# Patient Record
Sex: Male | Born: 1975 | Race: White | Hispanic: No | Marital: Married | Smoking: Never smoker
Health system: Southern US, Community
[De-identification: ages and names within clinical notes are randomized; demographics above are authoritative.]

## PROBLEM LIST (undated history)

## (undated) DIAGNOSIS — G518 Other disorders of facial nerve: Secondary | ICD-10-CM

## (undated) DIAGNOSIS — Z8249 Family history of ischemic heart disease and other diseases of the circulatory system: Secondary | ICD-10-CM

## (undated) DIAGNOSIS — R42 Dizziness and giddiness: Secondary | ICD-10-CM

## (undated) DIAGNOSIS — K219 Gastro-esophageal reflux disease without esophagitis: Secondary | ICD-10-CM

## (undated) HISTORY — PX: COLONOSCOPY: SHX174

## (undated) HISTORY — PX: TONSILLECTOMY: SUR1361

## (undated) HISTORY — DX: Other disorders of facial nerve: G51.8

## (undated) HISTORY — DX: Family history of ischemic heart disease and other diseases of the circulatory system: Z82.49

## (undated) HISTORY — PX: APPENDECTOMY: SHX54

## (undated) HISTORY — PX: UPPER GASTROINTESTINAL ENDOSCOPY: SHX188

## (undated) HISTORY — DX: Dizziness and giddiness: R42

---

## 2015-04-06 ENCOUNTER — Ambulatory Visit: Payer: Self-pay | Admitting: Family Medicine

## 2017-04-03 ENCOUNTER — Ambulatory Visit: Payer: Managed Care, Other (non HMO) | Admitting: Neurology

## 2017-04-04 ENCOUNTER — Encounter: Payer: Self-pay | Admitting: Neurology

## 2017-05-18 ENCOUNTER — Ambulatory Visit (INDEPENDENT_AMBULATORY_CARE_PROVIDER_SITE_OTHER): Payer: Managed Care, Other (non HMO) | Admitting: Neurology

## 2017-05-18 ENCOUNTER — Encounter (INDEPENDENT_AMBULATORY_CARE_PROVIDER_SITE_OTHER): Payer: Self-pay

## 2017-05-18 ENCOUNTER — Encounter: Payer: Self-pay | Admitting: Neurology

## 2017-05-18 VITALS — BP 129/85 | HR 67 | Ht 72.5 in | Wt 229.0 lb

## 2017-05-18 DIAGNOSIS — G518 Other disorders of facial nerve: Secondary | ICD-10-CM

## 2017-05-18 DIAGNOSIS — M792 Neuralgia and neuritis, unspecified: Secondary | ICD-10-CM

## 2017-05-18 DIAGNOSIS — H9190 Unspecified hearing loss, unspecified ear: Secondary | ICD-10-CM

## 2017-05-18 DIAGNOSIS — R413 Other amnesia: Secondary | ICD-10-CM

## 2017-05-18 DIAGNOSIS — R42 Dizziness and giddiness: Secondary | ICD-10-CM

## 2017-05-18 DIAGNOSIS — R519 Headache, unspecified: Secondary | ICD-10-CM

## 2017-05-18 DIAGNOSIS — R51 Headache with orthostatic component, not elsewhere classified: Secondary | ICD-10-CM

## 2017-05-18 DIAGNOSIS — I671 Cerebral aneurysm, nonruptured: Secondary | ICD-10-CM

## 2017-05-18 DIAGNOSIS — H539 Unspecified visual disturbance: Secondary | ICD-10-CM

## 2017-05-18 NOTE — Progress Notes (Signed)
[facility]    Provider:  Dr Lucia GaskinsAhern Referring Provider: Joycelyn RuaMeyers, Stephen, MD Primary Care Physician:  Joycelyn RuaMeyers, Stephen, MD  CC:  Headache, dizziness  HPI:  Ralph Perez is a 41 y.o. male here as a referral from Dr. Lenise ArenaMeyers for headaches for 5-6 years or longer. Started in 2011, he gets pains in his head like an electric shock, will last 2-3 seconds. Father passed away from an aneurysm. The symptoms would occur once a month, no known triggers could be anytime day or night. 3 years ago [place] and he had an episode of car sickness, since then he has motion sickness. He can't get on a swing, he has a spinning in his head, any movement will cause the spinning but if he stops moving it will go away. He was on a boat and felt spinning, no nausea or vomiting. In the last 5-6 months the pain is more frequent and longer lasting and more severe lasts 5-6 seconds. He laid down last night and had the spinning sensation, positional head pain. He feels like he is having a hard time remembering things, he is struggling to remember things that are easy and it takes him a second. For example, takes him a minute to remember names of famous actors for example. Blurry vision. The shooting pains are in the left side of the forehead, feels like an electrical shock just once for a few seconds, happens once a week and can stop him because it can be severe, has not woken him up at night, No lacrimation or rhinorrhea or other autonomic symptoms. He has light sensitivity. He has pulsatile tinnitus. No headaches. No other focal neurologic deficits, associated symptoms, inciting events or modifiable factors. Does not happen at the same time.   Review of Systems: Patient complains of symptoms per HPI as well as the following symptoms: Memory loss, headache, dizziness, spinning sensation: . Pertinent negatives and positives per HPI. All others negative.   Social History   Social History  .  Marital status: Married    Spouse name: N/A  . Number of children: N/A  . Years of education: N/A   Occupational History  . Not on file.   Social History Main Topics  . Smoking status: Never Smoker  . Smokeless tobacco: Never Used  . Alcohol use Yes  . Drug use: No  . Sexual activity: Not on file   Other Topics Concern  . Not on file   Social History Narrative  . No narrative on file    Family History  Problem Relation Age of Onset  . Aneurysm Father   . Clotting disorder Father     Past Medical History:  Diagnosis Date  . Facial neuralgia   . FHx: brain aneurysm   . Vertigo     Past Surgical History:  Procedure Laterality Date  . APPENDECTOMY      No current outpatient prescriptions on file.   No current facility-administered medications for this visit.     Allergies as of 05/18/2017  . (No Known Allergies)    Vitals: BP 129/85   Pulse 67   Ht 6' 0.5" (1.842 m)   Wt 229 lb (103.9 kg)   BMI 30.63 kg/m  Last Weight:  Wt Readings from Last 1 Encounters:  05/18/17 229 lb (103.9 kg)   Last Height:   Ht Readings from Last 1 Encounters:  05/18/17 6' 0.5" (1.842 m)    Physical exam: Exam: Gen: NAD, conversant, well  nourised, obese, well groomed                     CV: RRR, no MRG. No Carotid Bruits. No peripheral edema, warm, nontender Eyes: Conjunctivae clear without exudates or hemorrhage  Neuro: Detailed Neurologic Exam  Speech:    Speech is normal; fluent and spontaneous with normal comprehension.  Cognition:    The patient is oriented to person, place, and time;     recent and remote memory intact;     language fluent;     normal attention, concentration,     fund of knowledge Cranial Nerves:    The pupils are equal, round, and reactive to light. The fundi are normal and spontaneous venous pulsations are present. Visual fields are full to finger confrontation. Extraocular movements are intact. Trigeminal sensation is intact and the  muscles of mastication are normal. The face is symmetric. The palate elevates in the midline. Hearing intact. Voice is normal. Shoulder shrug is normal. The tongue has normal motion without fasciculations.   Coordination:    Normal finger to nose and heel to shin. Normal rapid alternating movements.   Gait:    Heel-toe and tandem gait are normal.   Motor Observation:    No asymmetry, no atrophy, and no involuntary movements noted. Tone:    Normal muscle tone.    Posture:    Posture is normal. normal erect    Strength:    Strength is V/V in the upper and lower limbs.      Sensation: intact to LT     Reflex Exam:  DTR's:    Deep tendon reflexes in the upper and lower extremities are normal bilaterally.   Toes:    The toes are downgoing bilaterally.   Clonus:    Clonus is absent.      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 Need an MRA of the head to evaluate for left-sided cerebral aneurysm especially given the family history of cerebral aneurysm. Patient also reports positional headache, vertigo, memory changes, imbalance, hearing changes so need MRI brain to eval for strokes or lesions such as schwannomas of the 8th cranial nerve. Also recommend f/u with eye doctor. Had labwork completed last year at work, will request labs for review and comparison.  Orders Placed This Encounter  Procedures  . MR BRAIN W WO CONTRAST  . MR MRA HEAD WO CONTRAST  . Comprehensive metabolic panel  . CBC  . B12 and Folate Panel  . Methylmalonic acid, serum  . TSH     Cc:  Joycelyn Rua, MD  Naomie Dean, MD  [facility] [address] [place], [place] [address]  Phone [phone] Fax [phone]

## 2017-05-18 NOTE — Patient Instructions (Addendum)
Remember to drink plenty of fluid, eat healthy meals and do not skip any meals. Try to eat protein with a every meal and eat a healthy snack such as fruit or nuts in between meals. Try to keep a regular sleep-wake schedule and try to exercise daily, particularly in the form of walking, 20-30 minutes a day, if you can.   As far as diagnostic testing: MRI brain and MRA of the head  I would like to see you back in 6 weeks, sooner if we need to. Please call us with any interim questions, concerns, problems, updates or refill requests.   Please also call us for any test results so we can go over those with you on the phone.  My clinical assistant and will answer any of your questions and relay your messages to me and also relay most of my messages to you.   Our phone number is [phone]. We also have an after hours call service for urgent matters and there is a physician on-call for urgent questions. For any emergencies you know to call 911 or go to the nearest emergency room  Differential: Supraorbital neuralgia, trigeminal neuragia, or Trigeminal Autonomic Cephalgias (SUNA, SUNCT, Cluster)   Dizziness Dizziness is a common problem. It is a feeling of unsteadiness or light-headedness. You may feel like you are about to faint. Dizziness can lead to injury if you stumble or fall. Anyone can become dizzy, but dizziness is more common in older adults. This condition can be caused by a number of things, including medicines, dehydration, or illness. Follow these instructions at home: Taking these steps may help with your condition: Eating and drinking  Drink enough fluid to keep your urine clear or pale yellow. This helps to keep you from becoming dehydrated. Try to drink more clear fluids, such as water.  Do not drink alcohol.  Limit your caffeine intake if directed by your health care provider.  Limit your salt intake if directed by your health care provider. Activity  Avoid making quick  movements. ? Rise slowly from chairs and steady yourself until you feel okay. ? In the morning, first sit up on the side of the bed. When you feel okay, stand slowly while you hold onto something until you know that your balance is fine.  Move your legs often if you need to stand in one place for a long time. Tighten and relax your muscles in your legs while you are standing.  Do not drive or operate heavy machinery if you feel dizzy.  Avoid bending down if you feel dizzy. Place items in your home so that they are easy for you to reach without leaning over. Lifestyle  Do not use any tobacco products, including cigarettes, chewing tobacco, or electronic cigarettes. If you need help quitting, ask your health care provider.  Try to reduce your stress level, such as with yoga or meditation. Talk with your health care provider if you need help. General instructions  Watch your dizziness for any changes.  Take medicines only as directed by your health care provider. Talk with your health care provider if you think that your dizziness is caused by a medicine that you are taking.  Tell a friend or a family member that you are feeling dizzy. If he or she notices any changes in your behavior, have this person call your health care provider.  Keep all follow-up visits as directed by your health care provider. This is important. Contact a health care provider if:  Your dizziness does not go away.  Your dizziness or light-headedness gets worse.  You feel nauseous.  You have reduced hearing.  You have new symptoms.  You are unsteady on your feet or you feel like the room is spinning. Get help right away if:  You vomit or have diarrhea and are unable to eat or drink anything.  You have problems talking, walking, swallowing, or using your arms, hands, or legs.  You feel generally weak.  You are not thinking clearly or you have trouble forming sentences. It may take a friend or family  member to notice this.  You have chest pain, abdominal pain, shortness of breath, or sweating.  Your vision changes.  You notice any bleeding.  You have a headache.  You have neck pain or a stiff neck.  You have a fever. This information is not intended to replace advice given to you by your health care provider. Make sure you discuss any questions you have with your health care provider. Document Released: 03/01/2001 Document Revised: 02/11/2016 Document Reviewed: 09/01/2014 Elsevier Interactive Patient Education  2017 Elsevier Inc.   Cluster Headache A cluster headache is a type of headache that causes deep, intense head pain. Cluster headaches can last from 15 minutes to 3 hours. They usually occur:  On one side of the head. They may occur on the other side when a new cluster of headaches begins.  Repeatedly over weeks to months.  Several times a day.  At the same time of day, often at night.  More often in the fall and springtime.  What are the causes? The cause of this condition is not known. What increases the risk? This condition is more likely to develop in:  Males.  People who drink alcohol.  People who smoke or use products that contain nicotine or tobacco.  People who take medicines that cause blood vessels to expand, such as nitroglycerin.  People who take antihistamines.  What are the signs or symptoms? Symptoms of this condition include:  Severe pain on one side of the head that begins behind or around your eye or temple.  Pain on one side of the head.  Nausea.  Sensitivity to light.  Runny nose and nasal stuffiness.  Sweaty, pale skin on the face.  Droopy or swollen eyelid, eye redness, or tearing.  Restlessness and agitation.  How is this diagnosed? This condition may be diagnosed based on:  Your symptoms.  A physical exam.  Your health care provider may order tests to see if your headaches are caused by another medical  condition. These tests may show that you do not have cluster headaches. Tests may include:  A CT scan of your head.  An MRI of your head.  Lab tests.  How is this treated? This condition may be treated with:  Medicines to relieve pain and to prevent repeated (recurrent) attacks. Some people may need a combination of medicines.  Oxygen. This helps to relieve pain.  Follow these instructions at home: Headache diary Keep a headache diary as told by your health care provider. Doing this can help you and your health care provider figure out what triggers your headaches. In your headache diary, include information about:  The time of day that your headache started and what you were doing when it began.  How long your headache lasted.  Where your pain started and whether it moved to other areas.  The type of pain, such as burning, stabbing, throbbing, or cramping.  Your level of pain. Use a pain scale and rate the pain with a number from 1 (mild) up to 10 (severe).  The treatment that you used, and any change in symptoms after treatment.  Medicines  Take over-the-counter and prescription medicines only as told by your health care provider.  Do not drive or use heavy machinery while taking prescription pain medicine.  Use oxygen as told by your health care provider. Lifestyle  Follow a regular sleep schedule. Do not vary the time that you go to bed or the amount that you sleep from day to day. It is important to stay on the same schedule during a cluster period to help prevent headaches.  Exercise regularly.  Eat a healthy diet and avoid foods that may trigger your headaches.  Avoid alcohol.  Do not use any products that contain nicotine or tobacco, such as cigarettes and e-cigarettes. If you need help quitting, ask your health care provider. Contact a health care provider if:  Your headaches change, become more severe, or occur more often.  The medicine or oxygen that  your health care provider recommended does not help. Get help right away if:  You faint.  You have weakness or numbness, especially on one side of your body or face.  You have double vision.  You have nausea or vomiting that does not go away within several hours.  You have trouble talking, walking, or keeping your balance.  You have pain or stiffness in your neck.  You have a fever. Summary  A cluster headache is a type of headache that causes deep, intense head pain, usually on one side of the head.  Keep a headache diary to help discover what triggers your headaches.  A regular sleep schedule can help prevent headaches. This information is not intended to replace advice given to you by your health care provider. Make sure you discuss any questions you have with your health care provider. Document Released: 09/05/2005 Document Revised: 05/17/2016 Document Reviewed: 05/17/2016 Elsevier Interactive Patient Education  2018 [employer].   Trigeminal Neuralgia Trigeminal neuralgia is a nerve disorder that causes attacks of severe facial pain. The attacks last from a few seconds to several minutes. They can happen for days, weeks, or months and then go away for months or years. Trigeminal neuralgia is also called tic douloureux. What are the causes? This condition is caused by damage to a nerve in the face that is called the trigeminal nerve. An attack can be triggered by:  Talking.  Chewing.  Putting on makeup.  Washing your face.  Shaving your face.  Brushing your teeth.  Touching your face.  What increases the risk? This condition is more likely to develop in:  Women.  People who are 13 years of age or older.  What are the signs or symptoms? The main symptom of this condition is pain in the jaw, lips, eyes, nose, scalp, forehead, and face. The pain may be intense, stabbing, electric, or shock-like. How is this diagnosed? This condition is diagnosed with a  physical exam. A CT scan or MRI may be done to rule out other conditions that can cause facial pain. How is this treated? This condition may be treated with:  Avoiding the things that trigger your attacks.  Pain medicine.  Surgery. This may be done in severe cases if other medical treatment does not provide relief.  Follow these instructions at home:  Take over-the-counter and prescription medicines only as told by your health care  provider.  If you wish to get pregnant, talk with your health care provider before you start trying to get pregnant.  Avoid the things that trigger your attacks. It may help to: ? Chew on the unaffected side of your mouth. ? Avoid touching your face. ? Avoid blasts of hot or cold air. Contact a health care provider if:  Your pain medicine is not helping.  You develop new, unexplained symptoms, such as: ? Double vision. ? Facial weakness. ? Changes in hearing or balance.  You become pregnant. Get help right away if:  Your pain is unbearable, and your pain medicine does not help. This information is not intended to replace advice given to you by your health care provider. Make sure you discuss any questions you have with your health care provider. Document Released: 09/02/2000 Document Revised: 05/08/2016 Document Reviewed: 12/29/2014 Elsevier Interactive Patient Education  [employer]2018 Elsevier Inc.

## 2017-05-19 ENCOUNTER — Telehealth: Payer: Self-pay | Admitting: *Deleted

## 2017-05-19 NOTE — Telephone Encounter (Signed)
-----   Message from Anson FretAntonia B Ahern, MD sent at 05/19/2017 11:25 AM EDT ----- Labs normal

## 2017-05-19 NOTE — Telephone Encounter (Signed)
Called and spoke with patient about normal labs per AA.MD note. He verbalized understanding.

## 2017-05-20 LAB — CBC WITH DIFFERENTIAL/PLATELET
Basophils Absolute: 0 10*3/uL (ref 0.0–0.2)
Basos: 0 %
EOS (ABSOLUTE): 0.2 10*3/uL (ref 0.0–0.4)
EOS: 4 %
HEMATOCRIT: 45.6 % (ref 37.5–51.0)
HEMOGLOBIN: 15.3 g/dL (ref 13.0–17.7)
Immature Grans (Abs): 0 10*3/uL (ref 0.0–0.1)
Immature Granulocytes: 0 %
LYMPHS ABS: 2.2 10*3/uL (ref 0.7–3.1)
Lymphs: 40 %
MCH: 28.8 pg (ref 26.6–33.0)
MCHC: 33.6 g/dL (ref 31.5–35.7)
MCV: 86 fL (ref 79–97)
MONOCYTES: 9 %
Monocytes Absolute: 0.5 10*3/uL (ref 0.1–0.9)
NEUTROS ABS: 2.5 10*3/uL (ref 1.4–7.0)
Neutrophils: 47 %
Platelets: 244 10*3/uL (ref 150–379)
RBC: 5.32 x10E6/uL (ref 4.14–5.80)
RDW: 13.9 % (ref 12.3–15.4)
WBC: 5.4 10*3/uL (ref 3.4–10.8)

## 2017-05-20 LAB — COMPREHENSIVE METABOLIC PANEL
ALBUMIN: 4.7 g/dL (ref 3.5–5.5)
ALK PHOS: 60 IU/L (ref 39–117)
ALT: 32 IU/L (ref 0–44)
AST: 21 IU/L (ref 0–40)
Albumin/Globulin Ratio: 1.7 (ref 1.2–2.2)
BUN / CREAT RATIO: 11 (ref 9–20)
BUN: 11 mg/dL (ref 6–24)
Bilirubin Total: 0.6 mg/dL (ref 0.0–1.2)
CO2: 22 mmol/L (ref 20–29)
CREATININE: 1.01 mg/dL (ref 0.76–1.27)
Calcium: 10.1 mg/dL (ref 8.7–10.2)
Chloride: 103 mmol/L (ref 96–106)
GFR calc non Af Amer: 91 mL/min/{1.73_m2} (ref >59)
GFR, EST AFRICAN AMERICAN: 106 mL/min/{1.73_m2} (ref >59)
GLUCOSE: 91 mg/dL (ref 65–99)
Globulin, Total: 2.8 g/dL (ref 1.5–4.5)
Potassium: 4.3 mmol/L (ref 3.5–5.2)
Sodium: 143 mmol/L (ref 134–144)
TOTAL PROTEIN: 7.5 g/dL (ref 6.0–8.5)

## 2017-05-20 LAB — TSH: TSH: 2.11 u[IU]/mL (ref 0.450–4.500)

## 2017-05-20 LAB — B12 AND FOLATE PANEL
FOLATE: 14.1 ng/mL (ref >3.0)
VITAMIN B 12: 687 pg/mL (ref 232–1245)

## 2017-05-20 LAB — METHYLMALONIC ACID, SERUM: METHYLMALONIC ACID: 182 nmol/L (ref 0–378)

## 2017-05-30 ENCOUNTER — Ambulatory Visit
Admission: RE | Admit: 2017-05-30 | Discharge: 2017-05-30 | Disposition: A | Discharge status: Home / Self Care | Payer: Managed Care, Other (non HMO) | Source: Ambulatory Visit | Attending: Neurology | Admitting: Neurology

## 2017-05-30 DIAGNOSIS — R51 Headache with orthostatic component, not elsewhere classified: Secondary | ICD-10-CM

## 2017-05-30 DIAGNOSIS — M792 Neuralgia and neuritis, unspecified: Secondary | ICD-10-CM

## 2017-05-30 DIAGNOSIS — H9190 Unspecified hearing loss, unspecified ear: Secondary | ICD-10-CM

## 2017-05-30 DIAGNOSIS — R413 Other amnesia: Secondary | ICD-10-CM

## 2017-05-30 DIAGNOSIS — R519 Headache, unspecified: Secondary | ICD-10-CM

## 2017-05-30 DIAGNOSIS — I671 Cerebral aneurysm, nonruptured: Secondary | ICD-10-CM

## 2017-05-30 DIAGNOSIS — H539 Unspecified visual disturbance: Secondary | ICD-10-CM

## 2017-05-30 DIAGNOSIS — R42 Dizziness and giddiness: Secondary | ICD-10-CM

## 2017-05-30 MED ORDER — GADOBENATE DIMEGLUMINE 529 MG/ML IV SOLN
20.0000 mL | Freq: Once | INTRAVENOUS | Status: AC | PRN
  Administered 2017-05-30: 20 mL via INTRAVENOUS

## 2017-06-05 ENCOUNTER — Telehealth: Payer: Self-pay | Admitting: Neurology

## 2017-06-05 NOTE — Telephone Encounter (Signed)
LVM for patient about results per AA,MD note. Gave [facility] phone number if he has further questions or concerns.

## 2017-06-05 NOTE — Telephone Encounter (Signed)
Patient calling to get MRI results. °

## 2017-06-05 NOTE — Telephone Encounter (Signed)
Patient has questions about his MRI and his tongue.

## 2017-06-05 NOTE — Telephone Encounter (Signed)
Called and spoke with patient again about MRI results per AA,MD note. He will f/u with PCP about this. He requested results be faxed to Dr Lenise Arena for them to have a copy.  Verified he has a f/u on 07/04/17 at 830am.   Faxed results to PCP at [phone]. Received confirmation.

## 2017-06-05 NOTE — Telephone Encounter (Signed)
Ralph Perez, MRi brain and MRA head were both normal. Incidentally noted is a 2.1cm cyst at the base of the tongue, likely benign but recommend follow up with primary care wihtin 6 weeks to discuss. MRI report will be out on mycone soon thanks

## 2017-07-04 ENCOUNTER — Ambulatory Visit: Payer: Managed Care, Other (non HMO) | Admitting: Neurology

## 2017-07-04 ENCOUNTER — Telehealth: Payer: Self-pay

## 2017-07-04 NOTE — Telephone Encounter (Signed)
Patient no showed his follow up 07/04/17 with Dr. Lucia Gaskins.

## 2017-07-07 ENCOUNTER — Encounter: Payer: Self-pay | Admitting: Neurology

## 2017-08-21 ENCOUNTER — Ambulatory Visit (INDEPENDENT_AMBULATORY_CARE_PROVIDER_SITE_OTHER): Payer: Managed Care, Other (non HMO) | Admitting: Orthopaedic Surgery

## 2017-08-21 ENCOUNTER — Encounter (INDEPENDENT_AMBULATORY_CARE_PROVIDER_SITE_OTHER): Payer: Self-pay | Admitting: Orthopaedic Surgery

## 2017-08-21 ENCOUNTER — Ambulatory Visit (INDEPENDENT_AMBULATORY_CARE_PROVIDER_SITE_OTHER): Payer: Managed Care, Other (non HMO)

## 2017-08-21 DIAGNOSIS — M25512 Pain in left shoulder: Secondary | ICD-10-CM | POA: Diagnosis not present

## 2017-08-21 MED ORDER — DICLOFENAC SODIUM 75 MG PO TBEC: 75.0000 mg | DELAYED_RELEASE_TABLET | Freq: Two times a day (BID) | ORAL | 2 refills | Status: DC

## 2017-08-21 NOTE — Progress Notes (Signed)
Office Visit Note   Patient: Ralph Perez           Date of Birth: 12-Nov-1975           MRN: [id number] Visit Date: 08/21/2017              Requested by: Ralph Perez, Stephen, MD [address] [place], [place] [id number] PCP: Ralph Perez, Stephen, MD   Assessment & Plan: Visit Diagnoses:  1. Acute pain of left shoulder     Plan: Impression is left SLAP tear.  Prescription for diclofenac.  Referral to Dr. Alvester MorinNewton for intra-articular steroid injection.  Referral to physical therapy.  Recheck in 4 weeks. Total face to face encounter time was greater than 45 minutes and over half of this time was spent in counseling and/or coordination of care.  Follow-Up Instructions: Return in about 4 weeks (around 09/18/2017).   Orders:  Orders Placed This Encounter  Procedures  . XR Shoulder Left  . Ambulatory referral to Physical Medicine Rehab   Meds ordered this encounter  Medications  . diclofenac (VOLTAREN) 75 MG EC tablet    Sig: Take 1 tablet (75 mg total) by mouth 2 (two) times daily.    Dispense:  30 tablet    Refill:  2      Procedures: No procedures performed   Clinical Data: No additional findings.   Subjective: Chief Complaint  Patient presents with  . Left Shoulder - Pain    Ralph Perez is a 41 year old gentleman who comes in today with 4-6-week history of left shoulder pain.  He originally felt the pain when he was bench pressing and felt a pop inside the shoulder.  He endorses a numb feeling deep inside the shoulder.  The pain is worse with abduction and seems to have gotten worse.  He denies any neck pain or radicular symptoms.  Pain is worse with use of the arm and better with rest.    Review of Systems  Constitutional: Negative.   All other systems reviewed and are negative.    Objective: Vital Signs: There were no vitals taken for this visit.  Physical Exam  Constitutional: He is oriented to person, place, and time. He appears well-developed and  well-nourished.  HENT:  Head: Normocephalic and atraumatic.  Eyes: Pupils are equal, round, and reactive to light.  Neck: Neck supple.  Pulmonary/Chest: Effort normal.  Abdominal: Soft.  Musculoskeletal: Normal range of motion.  Neurological: He is alert and oriented to person, place, and time.  Skin: Skin is warm.  Psychiatric: He has a normal mood and affect. His behavior is normal. Judgment and thought content normal.  Nursing note and vitals reviewed.   Ortho Exam Left shoulder exam shows a markedly positive O'Brien test.  Mildly positive crank test.  Rotator cuff is grossly intact and without any significant pain.  Negative speeds test.  Negative impingement.  Negative cross adduction.  Negative apprehension. Specialty Comments:  No specialty comments available.  Imaging: Xr Shoulder Left  Result Date: 08/21/2017 No acute or structural abnormalities    PMFS History: There are no active problems to display for this patient.  Past Medical History:  Diagnosis Date  . Facial neuralgia   . FHx: brain aneurysm   . Vertigo     Family History  Problem Relation Age of Onset  . Aneurysm Father   . Clotting disorder Father     Past Surgical History:  Procedure Laterality Date  . APPENDECTOMY     Social History  Occupational History  . Not on file  Tobacco Use  . Smoking status: Never Smoker  . Smokeless tobacco: Never Used  Substance and Sexual Activity  . Alcohol use: Yes  . Drug use: No  . Sexual activity: Not on file

## 2017-09-04 ENCOUNTER — Ambulatory Visit (INDEPENDENT_AMBULATORY_CARE_PROVIDER_SITE_OTHER): Payer: Managed Care, Other (non HMO) | Admitting: Physical Medicine and Rehabilitation

## 2017-09-04 ENCOUNTER — Encounter (INDEPENDENT_AMBULATORY_CARE_PROVIDER_SITE_OTHER): Payer: Self-pay | Admitting: Physical Medicine and Rehabilitation

## 2017-09-04 ENCOUNTER — Ambulatory Visit (INDEPENDENT_AMBULATORY_CARE_PROVIDER_SITE_OTHER): Payer: Managed Care, Other (non HMO)

## 2017-09-04 VITALS — BP 134/84 | HR 61 | Temp 97.9°F

## 2017-09-04 DIAGNOSIS — G8929 Other chronic pain: Secondary | ICD-10-CM | POA: Diagnosis not present

## 2017-09-04 DIAGNOSIS — M25512 Pain in left shoulder: Secondary | ICD-10-CM | POA: Diagnosis not present

## 2017-09-04 NOTE — Patient Instructions (Signed)

## 2017-09-04 NOTE — Progress Notes (Deleted)
Pt states pain in left shoulder. Picking up items increases pain. -Dye Allergy, -Driver, -BT

## 2017-09-04 NOTE — Progress Notes (Signed)
   Ralph Perez - 41 y.o. male MRN [id number]  Date of birth: Jul 21, 1976  Office Visit Note: Visit Date: 09/04/2017 PCP: Ralph RuaMeyers, Stephen, MD Referred by: Ralph RuaMeyers, Stephen, MD  Subjective: Chief Complaint  Patient presents with  . Left Shoulder - Pain   HPI: Ralph Perez is a 41 year old dominant gentleman who is followed by [name] for his left shoulder pain.  [name] feels like he may have a SLAP tear.    ROS Otherwise per HPI.  Assessment & Plan: Visit Diagnoses:  1. Chronic left shoulder pain     Plan: Findings:  Diagnostic and hopefully therapeutic anesthetic left glenohumeral joint arthrogram.  Patient did seem to have decreased range of motion during the anesthetic phase.    Meds & Orders: No orders of the defined types were placed in this encounter.   Orders Placed This Encounter  Procedures  . Large Joint Inj: L glenohumeral  . XR C-ARM NO REPORT    Follow-up: Return for [name] as scheduled.   Procedures: Large Joint Inj: L glenohumeral on 09/04/2017 9:36 AM Indications: pain and diagnostic evaluation Details: 22 G 3.5 in needle, anteromedial approach  Arthrogram: Yes  Medications: 80 mg triamcinolone acetonide 40 MG/ML; 3 mL bupivacaine 0.5 %  Arthrogram demonstrated excellent flow of contrast throughout the joint surface without extravasation or obvious defect.  The patient had relief of symptoms during the anesthetic phase of the injection.  Procedure, treatment alternatives, risks and benefits explained, specific risks discussed. Consent was given by the patient. Immediately prior to procedure a time out was called to verify the correct patient, procedure, equipment, support staff and site/side marked as required. Patient was prepped and draped in the usual sterile fashion.      No notes on file   Clinical History: No specialty comments available.  He reports that  has never smoked. he has never used smokeless tobacco. No results for input(s): HGBA1C,  LABURIC in the last 8760 hours.  Objective:  VS:  HT:    WT:   BMI:     BP:134/84  HR:61bpm  TEMP:97.9 F (36.6 C)(Oral)  RESP:  Physical Exam  Musculoskeletal:  Painful active range of motion of the left shoulder.  Negative drop arm test.    Ortho Exam Imaging: No results found.  Past Medical/Family/Surgical/Social History: Medications & Allergies reviewed per EMR There are no active problems to display for this patient.  Past Medical History:  Diagnosis Date  . Facial neuralgia   . FHx: brain aneurysm   . Vertigo    Family History  Problem Relation Age of Onset  . Aneurysm Father   . Clotting disorder Father    Past Surgical History:  Procedure Laterality Date  . APPENDECTOMY     Social History   Occupational History  . Not on file  Tobacco Use  . Smoking status: Never Smoker  . Smokeless tobacco: Never Used  Substance and Sexual Activity  . Alcohol use: Yes  . Drug use: No  . Sexual activity: Not on file

## 2017-09-18 ENCOUNTER — Encounter (INDEPENDENT_AMBULATORY_CARE_PROVIDER_SITE_OTHER): Payer: Self-pay | Admitting: Orthopaedic Surgery

## 2017-09-18 ENCOUNTER — Ambulatory Visit (INDEPENDENT_AMBULATORY_CARE_PROVIDER_SITE_OTHER): Payer: Managed Care, Other (non HMO) | Admitting: Orthopaedic Surgery

## 2017-09-18 DIAGNOSIS — M25512 Pain in left shoulder: Secondary | ICD-10-CM

## 2017-09-18 MED ORDER — BUPIVACAINE HCL 0.5 % IJ SOLN
3.0000 mL | INTRAMUSCULAR | Status: AC | PRN
  Administered 2017-09-18: 3 mL via INTRA_ARTICULAR

## 2017-09-18 MED ORDER — TRIAMCINOLONE ACETONIDE 40 MG/ML IJ SUSP
80.0000 mg | INTRAMUSCULAR | Status: AC | PRN
  Administered 2017-09-04: 80 mg via INTRA_ARTICULAR

## 2017-09-18 MED ORDER — BUPIVACAINE HCL 0.5 % IJ SOLN
3.0000 mL | INTRAMUSCULAR | Status: AC | PRN
Start: 2017-09-04 — End: 2017-09-04
  Administered 2017-09-04: 3 mL via INTRA_ARTICULAR

## 2017-09-18 MED ORDER — METHYLPREDNISOLONE ACETATE 40 MG/ML IJ SUSP
40.0000 mg | INTRAMUSCULAR | Status: AC | PRN
  Administered 2017-09-18: 40 mg via INTRA_ARTICULAR

## 2017-09-18 MED ORDER — LIDOCAINE HCL 1 % IJ SOLN
3.0000 mL | INTRAMUSCULAR | Status: AC | PRN
  Administered 2017-09-18: 3 mL

## 2017-09-18 NOTE — Progress Notes (Signed)
   Office Visit Note   Patient: Ralph Perez           Date of Birth: [birth year]           MRN: [id number] Visit Date: 09/18/2017              Requested by: Joycelyn RuaMeyers, Stephen, MD [address] [place], [place] [address] PCP: Joycelyn RuaMeyers, Stephen, MD   Assessment & Plan: Visit Diagnoses:  1. Acute pain of left shoulder     Plan: Impression shoulder pain biceps tendinitis.  Long head of biceps was injected today under sterile conditions.  Patient tolerates well. If not better over the next 2-3 weeks would recommend MR arthrogram of the left shoulder.  Patient did have good relief during the anesthetic phase.  Follow-Up Instructions: Return if symptoms worsen or fail to improve.   Orders:  No orders of the defined types were placed in this encounter.  No orders of the defined types were placed in this encounter.     Procedures: Large Joint Inj (Left bicipital groove) on 09/18/2017 4:49 PM Indications: pain Details: 22 G needle  Arthrogram: No  Medications: 3 mL lidocaine 1 %; 3 mL bupivacaine 0.5 %; 40 mg methylPREDNISolone acetate 40 MG/ML Outcome: tolerated well, no immediate complications Patient was prepped and draped in the usual sterile fashion.       Clinical Data: No additional findings.   Subjective: Chief Complaint  Patient presents with  . Left Shoulder - Pain    Patient follows up today status post left shoulder injection.  He really did not notice any improvement in his pain.  He still has anterior shoulder pain that will occasionally radiate into his left hand.  This is worse with lifting objects.    Review of Systems  Constitutional: Negative.   All other systems reviewed and are negative.    Objective: Vital Signs: There were no vitals taken for this visit.  Physical Exam  Constitutional: He is oriented to person, place, and time. He appears well-developed and well-nourished.  HENT:  Head: Normocephalic and atraumatic.  Eyes: Pupils  are equal, round, and reactive to light.  Neck: Neck supple.  Pulmonary/Chest: Effort normal.  Abdominal: Soft.  Musculoskeletal: Normal range of motion.  Neurological: He is alert and oriented to person, place, and time.  Skin: Skin is warm.  Psychiatric: He has a normal mood and affect. His behavior is normal. Judgment and thought content normal.  Nursing note and vitals reviewed.   Ortho Exam Left shoulder injection reveals tenderness along the bicipital groove.  Mildly positive Speed test.  Rotator cuff testing is normal Specialty Comments:  No specialty comments available.  Imaging: No results found.   PMFS History: There are no active problems to display for this patient.  Past Medical History:  Diagnosis Date  . Facial neuralgia   . FHx: brain aneurysm   . Vertigo     Family History  Problem Relation Age of Onset  . Aneurysm Father   . Clotting disorder Father     Past Surgical History:  Procedure Laterality Date  . APPENDECTOMY     Social History   Occupational History  . Not on file  Tobacco Use  . Smoking status: Never Smoker  . Smokeless tobacco: Never Used  Substance and Sexual Activity  . Alcohol use: Yes  . Drug use: No  . Sexual activity: Not on file

## 2017-10-10 ENCOUNTER — Telehealth (INDEPENDENT_AMBULATORY_CARE_PROVIDER_SITE_OTHER): Payer: Self-pay | Admitting: Orthopaedic Surgery

## 2017-10-10 ENCOUNTER — Other Ambulatory Visit (INDEPENDENT_AMBULATORY_CARE_PROVIDER_SITE_OTHER): Payer: Self-pay

## 2017-10-10 DIAGNOSIS — G8929 Other chronic pain: Secondary | ICD-10-CM

## 2017-10-10 DIAGNOSIS — M25512 Pain in left shoulder: Principal | ICD-10-CM

## 2017-10-10 NOTE — Telephone Encounter (Signed)
Ok, mr arthrogram shoulder r/o structural abnormalities

## 2017-10-10 NOTE — Telephone Encounter (Signed)
See message below °

## 2017-10-10 NOTE — Telephone Encounter (Signed)
Patient called stating that the last injection he received did not help his shoulder and would like to be scheduled for an MRI.  CB#[phone].  Thank you.

## 2017-10-10 NOTE — Telephone Encounter (Signed)
ORDER MADE . THEY WILL CALL HIM TO SCHEDULE.  FOLLOW UP AFTER MRI

## 2017-10-25 ENCOUNTER — Other Ambulatory Visit (INDEPENDENT_AMBULATORY_CARE_PROVIDER_SITE_OTHER): Payer: Self-pay | Admitting: Orthopaedic Surgery

## 2017-10-25 DIAGNOSIS — M25512 Pain in left shoulder: Principal | ICD-10-CM

## 2017-10-25 DIAGNOSIS — G8929 Other chronic pain: Secondary | ICD-10-CM

## 2017-11-29 ENCOUNTER — Encounter (INDEPENDENT_AMBULATORY_CARE_PROVIDER_SITE_OTHER): Payer: Self-pay | Admitting: Radiology

## 2018-01-15 ENCOUNTER — Ambulatory Visit
Admission: RE | Admit: 2018-01-15 | Discharge: 2018-01-15 | Disposition: A | Discharge status: Home / Self Care | Payer: Managed Care, Other (non HMO) | Source: Ambulatory Visit | Attending: Orthopaedic Surgery | Admitting: Orthopaedic Surgery

## 2018-01-15 DIAGNOSIS — G8929 Other chronic pain: Secondary | ICD-10-CM

## 2018-01-15 DIAGNOSIS — M25512 Pain in left shoulder: Principal | ICD-10-CM

## 2018-01-15 MED ORDER — IOPAMIDOL (ISOVUE-M 200) INJECTION 41%
15.0000 mL | Freq: Once | INTRAMUSCULAR | Status: AC
  Administered 2018-01-15: 15 mL via INTRA_ARTICULAR

## 2018-01-22 ENCOUNTER — Ambulatory Visit (INDEPENDENT_AMBULATORY_CARE_PROVIDER_SITE_OTHER): Payer: Managed Care, Other (non HMO) | Admitting: Orthopaedic Surgery

## 2018-01-22 ENCOUNTER — Encounter (INDEPENDENT_AMBULATORY_CARE_PROVIDER_SITE_OTHER): Payer: Self-pay | Admitting: Orthopaedic Surgery

## 2018-01-22 VITALS — Ht 73.0 in | Wt 220.0 lb

## 2018-01-22 DIAGNOSIS — S43432A Superior glenoid labrum lesion of left shoulder, initial encounter: Secondary | ICD-10-CM | POA: Diagnosis not present

## 2018-01-22 NOTE — Progress Notes (Signed)
   Office Visit Note   Patient: Ralph Perez           Date of Birth: March 04, 1976           MRN: [id number] Visit Date: 01/22/2018              Requested by: Joycelyn Rua, MD [address] [place], [place] [address] PCP: Joycelyn Rua, MD   Assessment & Plan: Visit Diagnoses:  1. Superior labrum anterior-to-posterior (SLAP) tear of left shoulder     Plan: Impression is 42 year old gentleman with a SLAP tear of his left shoulder.  MRI findings were discussed with the patient today.  We discussed the associated risks and benefits and the expected postoperative recovery related to the surgery.  He understands and wished to proceed.  We will get him scheduled as soon as possible.  Follow-Up Instructions: Return for 2 week postop visit.   Orders:  No orders of the defined types were placed in this encounter.  No orders of the defined types were placed in this encounter.     Procedures: No procedures performed   Clinical Data: No additional findings.   Subjective: Chief Complaint  Patient presents with  . Left Shoulder - Pain, Follow-up    MRI review    Ralph Perez follows up today for MRI review of his left shoulder.  He has constant pain with his left shoulder even with ADLs.   Review of Systems  Constitutional: Negative.   All other systems reviewed and are negative.    Objective: Vital Signs: Ht  (1.854 m)   Wt 220 lb (99.8 kg)   BMI 29.03 kg/m   Physical Exam  Constitutional: He is oriented to person, place, and time. He appears well-developed and well-nourished.  Pulmonary/Chest: Effort normal.  Abdominal: Soft.  Neurological: He is alert and oriented to person, place, and time.  Skin: Skin is warm.  Psychiatric: He has a normal mood and affect. His behavior is normal. Judgment and thought content normal.  Nursing note and vitals reviewed.   Ortho Exam Left shoulder exam is positive for crank and O'Brien Specialty Comments:  No  specialty comments available.  Imaging: No results found.   PMFS History: There are no active problems to display for this patient.  Past Medical History:  Diagnosis Date  . Facial neuralgia   . FHx: brain aneurysm   . Vertigo     Family History  Problem Relation Age of Onset  . Aneurysm Father   . Clotting disorder Father     Past Surgical History:  Procedure Laterality Date  . APPENDECTOMY     Social History   Occupational History  . Not on file  Tobacco Use  . Smoking status: Never Smoker  . Smokeless tobacco: Never Used  Substance and Sexual Activity  . Alcohol use: Yes  . Drug use: No  . Sexual activity: Not on file

## 2018-02-07 ENCOUNTER — Encounter (HOSPITAL_BASED_OUTPATIENT_CLINIC_OR_DEPARTMENT_OTHER): Payer: Self-pay | Admitting: *Deleted

## 2018-02-07 ENCOUNTER — Other Ambulatory Visit: Payer: Self-pay

## 2018-02-14 ENCOUNTER — Other Ambulatory Visit: Payer: Self-pay

## 2018-02-14 ENCOUNTER — Ambulatory Visit (HOSPITAL_BASED_OUTPATIENT_CLINIC_OR_DEPARTMENT_OTHER): Payer: Managed Care, Other (non HMO) | Admitting: Anesthesiology

## 2018-02-14 ENCOUNTER — Ambulatory Visit (HOSPITAL_BASED_OUTPATIENT_CLINIC_OR_DEPARTMENT_OTHER)
Admission: RE | Admit: 2018-02-14 | Discharge: 2018-02-14 | Disposition: A | Discharge status: Home / Self Care | Payer: Managed Care, Other (non HMO) | Source: Ambulatory Visit | Attending: Orthopaedic Surgery | Admitting: Orthopaedic Surgery

## 2018-02-14 ENCOUNTER — Encounter (HOSPITAL_BASED_OUTPATIENT_CLINIC_OR_DEPARTMENT_OTHER)
Admission: RE | Disposition: A | Discharge status: Home / Self Care | Payer: Self-pay | Source: Ambulatory Visit | Attending: Orthopaedic Surgery

## 2018-02-14 ENCOUNTER — Encounter (HOSPITAL_BASED_OUTPATIENT_CLINIC_OR_DEPARTMENT_OTHER): Payer: Self-pay | Admitting: Anesthesiology

## 2018-02-14 DIAGNOSIS — Z79899 Other long term (current) drug therapy: Secondary | ICD-10-CM | POA: Insufficient documentation

## 2018-02-14 DIAGNOSIS — M7582 Other shoulder lesions, left shoulder: Secondary | ICD-10-CM | POA: Diagnosis not present

## 2018-02-14 DIAGNOSIS — X58XXXA Exposure to other specified factors, initial encounter: Secondary | ICD-10-CM | POA: Insufficient documentation

## 2018-02-14 DIAGNOSIS — K219 Gastro-esophageal reflux disease without esophagitis: Secondary | ICD-10-CM | POA: Diagnosis not present

## 2018-02-14 DIAGNOSIS — S43432A Superior glenoid labrum lesion of left shoulder, initial encounter: Secondary | ICD-10-CM | POA: Diagnosis not present

## 2018-02-14 HISTORY — DX: Gastro-esophageal reflux disease without esophagitis: K21.9

## 2018-02-14 HISTORY — PX: SHOULDER ARTHROSCOPY WITH SUBACROMIAL DECOMPRESSION: SHX5684

## 2018-02-14 HISTORY — PX: BICEPT TENODESIS: SHX5116

## 2018-02-14 SURGERY — SHOULDER ARTHROSCOPY WITH SUBACROMIAL DECOMPRESSION
Anesthesia: Regional | Site: Shoulder | Laterality: Left

## 2018-02-14 MED ORDER — CEFAZOLIN SODIUM-DEXTROSE 2-4 GM/100ML-% IV SOLN
INTRAVENOUS | Status: AC
  Filled 2018-02-14: qty 100

## 2018-02-14 MED ORDER — MIDAZOLAM HCL 2 MG/2ML IJ SOLN
INTRAMUSCULAR | Status: AC
  Filled 2018-02-14: qty 2

## 2018-02-14 MED ORDER — FENTANYL CITRATE (PF) 100 MCG/2ML IJ SOLN
INTRAMUSCULAR | Status: AC
  Filled 2018-02-14: qty 2

## 2018-02-14 MED ORDER — CEFAZOLIN SODIUM-DEXTROSE 2-4 GM/100ML-% IV SOLN
2.0000 g | INTRAVENOUS | Status: AC
  Administered 2018-02-14: 2 g via INTRAVENOUS

## 2018-02-14 MED ORDER — PROPOFOL 500 MG/50ML IV EMUL
INTRAVENOUS | Status: AC
Start: 2018-02-14 — End: ?
  Filled 2018-02-14: qty 50

## 2018-02-14 MED ORDER — PROMETHAZINE HCL 25 MG PO TABS: 25.0000 mg | ORAL_TABLET | Freq: Four times a day (QID) | ORAL | 1 refills | Status: AC | PRN

## 2018-02-14 MED ORDER — LIDOCAINE HCL (CARDIAC) PF 100 MG/5ML IV SOSY
PREFILLED_SYRINGE | INTRAVENOUS | Status: AC
Start: 2018-02-14 — End: ?
  Filled 2018-02-14: qty 5

## 2018-02-14 MED ORDER — CHLORHEXIDINE GLUCONATE 4 % EX LIQD: 60.0000 mL | Freq: Once | CUTANEOUS | Status: DC

## 2018-02-14 MED ORDER — LIDOCAINE HCL (CARDIAC) PF 100 MG/5ML IV SOSY
PREFILLED_SYRINGE | INTRAVENOUS | Status: DC | PRN
  Administered 2018-02-14: 50 mg via INTRAVENOUS

## 2018-02-14 MED ORDER — SODIUM CHLORIDE 0.9 % IR SOLN
Status: DC | PRN
  Administered 2018-02-14: 3000 mL

## 2018-02-14 MED ORDER — BUPIVACAINE-EPINEPHRINE (PF) 0.25% -1:200000 IJ SOLN
INTRAMUSCULAR | Status: AC
  Filled 2018-02-14: qty 30

## 2018-02-14 MED ORDER — SUGAMMADEX SODIUM 200 MG/2ML IV SOLN
INTRAVENOUS | Status: AC
  Filled 2018-02-14: qty 2

## 2018-02-14 MED ORDER — LACTATED RINGERS IV SOLN
INTRAVENOUS | Status: DC
  Administered 2018-02-14: 07:00:00 via INTRAVENOUS

## 2018-02-14 MED ORDER — DEXAMETHASONE SODIUM PHOSPHATE 4 MG/ML IJ SOLN
INTRAMUSCULAR | Status: DC | PRN
  Administered 2018-02-14: 10 mg via INTRAVENOUS

## 2018-02-14 MED ORDER — BUPIVACAINE-EPINEPHRINE (PF) 0.25% -1:200000 IJ SOLN
INTRAMUSCULAR | Status: DC | PRN
  Administered 2018-02-14: 8 mL

## 2018-02-14 MED ORDER — ROCURONIUM BROMIDE 100 MG/10ML IV SOLN
INTRAVENOUS | Status: DC | PRN
  Administered 2018-02-14: 50 mg via INTRAVENOUS

## 2018-02-14 MED ORDER — MIDAZOLAM HCL 2 MG/2ML IJ SOLN
1.0000 mg | INTRAMUSCULAR | Status: DC | PRN
  Administered 2018-02-14: 2 mg via INTRAVENOUS

## 2018-02-14 MED ORDER — ROCURONIUM BROMIDE 50 MG/5ML IV SOLN
INTRAVENOUS | Status: AC
  Filled 2018-02-14: qty 1

## 2018-02-14 MED ORDER — ONDANSETRON HCL 4 MG PO TABS: 4.0000 mg | ORAL_TABLET | Freq: Three times a day (TID) | ORAL | 0 refills | Status: AC | PRN

## 2018-02-14 MED ORDER — BUPIVACAINE-EPINEPHRINE (PF) 0.5% -1:200000 IJ SOLN
INTRAMUSCULAR | Status: AC
  Filled 2018-02-14: qty 30

## 2018-02-14 MED ORDER — ROPIVACAINE HCL 7.5 MG/ML IJ SOLN
INTRAMUSCULAR | Status: DC | PRN
  Administered 2018-02-14: 20 mL via PERINEURAL

## 2018-02-14 MED ORDER — ONDANSETRON HCL 4 MG/2ML IJ SOLN
INTRAMUSCULAR | Status: AC
Start: 2018-02-14 — End: ?
  Filled 2018-02-14: qty 2

## 2018-02-14 MED ORDER — ONDANSETRON HCL 4 MG/2ML IJ SOLN
INTRAMUSCULAR | Status: DC | PRN
  Administered 2018-02-14: 4 mg via INTRAVENOUS

## 2018-02-14 MED ORDER — SENNOSIDES-DOCUSATE SODIUM 8.6-50 MG PO TABS: 1.0000 | ORAL_TABLET | Freq: Every evening | ORAL | 1 refills | Status: AC | PRN

## 2018-02-14 MED ORDER — SCOPOLAMINE 1 MG/3DAYS TD PT72
1.0000 | MEDICATED_PATCH | Freq: Once | TRANSDERMAL | Status: DC | PRN
Start: 2018-02-14 — End: 2018-02-14

## 2018-02-14 MED ORDER — DEXAMETHASONE SODIUM PHOSPHATE 10 MG/ML IJ SOLN
INTRAMUSCULAR | Status: AC
  Filled 2018-02-14: qty 1

## 2018-02-14 MED ORDER — PHENYLEPHRINE 40 MCG/ML (10ML) SYRINGE FOR IV PUSH (FOR BLOOD PRESSURE SUPPORT)
PREFILLED_SYRINGE | INTRAVENOUS | Status: AC
  Filled 2018-02-14: qty 10

## 2018-02-14 MED ORDER — OXYCODONE-ACETAMINOPHEN 5-325 MG PO TABS: 1.0000 | ORAL_TABLET | ORAL | 0 refills | Status: AC | PRN

## 2018-02-14 MED ORDER — PROPOFOL 10 MG/ML IV BOLUS
INTRAVENOUS | Status: DC | PRN
  Administered 2018-02-14: 50 mg via INTRAVENOUS
  Administered 2018-02-14: 200 mg via INTRAVENOUS

## 2018-02-14 MED ORDER — FENTANYL CITRATE (PF) 100 MCG/2ML IJ SOLN: 50.0000 ug | INTRAMUSCULAR | Status: DC | PRN

## 2018-02-14 MED ORDER — SUGAMMADEX SODIUM 200 MG/2ML IV SOLN
INTRAVENOUS | Status: DC | PRN
  Administered 2018-02-14: 200 mg via INTRAVENOUS

## 2018-02-14 MED ORDER — METHOCARBAMOL 750 MG PO TABS: 750.0000 mg | ORAL_TABLET | Freq: Two times a day (BID) | ORAL | 0 refills | Status: AC | PRN

## 2018-02-14 MED ORDER — FENTANYL CITRATE (PF) 100 MCG/2ML IJ SOLN: 25.0000 ug | INTRAMUSCULAR | Status: DC | PRN

## 2018-02-14 MED ORDER — ESMOLOL HCL 100 MG/10ML IV SOLN
INTRAVENOUS | Status: AC
  Filled 2018-02-14: qty 10

## 2018-02-14 MED ORDER — PROPOFOL 10 MG/ML IV BOLUS
INTRAVENOUS | Status: AC
  Filled 2018-02-14: qty 20

## 2018-02-14 MED ORDER — ONDANSETRON HCL 4 MG/2ML IJ SOLN: 4.0000 mg | Freq: Once | INTRAMUSCULAR | Status: DC | PRN

## 2018-02-14 SURGICAL SUPPLY — 70 items
ANCHOR BIOCOMP SWIVELOCK (Anchor) ×3 IMPLANT
BENZOIN TINCTURE PRP APPL 2/3 (GAUZE/BANDAGES/DRESSINGS) IMPLANT
BLADE 4.2CUDA (BLADE) ×3 IMPLANT
BLADE CUTTER GATOR 3.5 (BLADE) IMPLANT
BLADE GREAT WHITE 4.2 (BLADE) IMPLANT
BLADE SURG 15 STRL LF DISP TIS (BLADE) IMPLANT
BLADE SURG 15 STRL SS (BLADE)
BNDG COHESIVE 4X5 TAN STRL (GAUZE/BANDAGES/DRESSINGS) ×3 IMPLANT
BUR OVAL 4.0 (BURR) IMPLANT
CANNULA 5.75X71 LONG (CANNULA) ×3 IMPLANT
CANNULA TWIST IN 8.25X7CM (CANNULA) IMPLANT
DECANTER SPIKE VIAL GLASS SM (MISCELLANEOUS) IMPLANT
DERMABOND ADVANCED (GAUZE/BANDAGES/DRESSINGS)
DERMABOND ADVANCED .7 DNX12 (GAUZE/BANDAGES/DRESSINGS) IMPLANT
DRAPE IMP U-DRAPE 54X76 (DRAPES) ×3 IMPLANT
DRAPE INCISE IOBAN 66X45 STRL (DRAPES) ×3 IMPLANT
DRAPE STERI 35X30 U-POUCH (DRAPES) ×3 IMPLANT
DRAPE U-SHAPE 47X51 STRL (DRAPES) ×6 IMPLANT
DRAPE U-SHAPE 76X120 STRL (DRAPES) ×6 IMPLANT
DRSG PAD ABDOMINAL 8X10 ST (GAUZE/BANDAGES/DRESSINGS) ×3 IMPLANT
DURAPREP 26ML APPLICATOR (WOUND CARE) ×3 IMPLANT
ELECT REM PT RETURN 9FT ADLT (ELECTROSURGICAL) ×3
ELECTRODE REM PT RTRN 9FT ADLT (ELECTROSURGICAL) ×2 IMPLANT
GAUZE SPONGE 4X4 12PLY STRL (GAUZE/BANDAGES/DRESSINGS) ×3 IMPLANT
GLOVE BIO SURGEON STRL SZ 6.5 (GLOVE) ×3 IMPLANT
GLOVE BIOGEL PI IND STRL 7.0 (GLOVE) ×4 IMPLANT
GLOVE BIOGEL PI INDICATOR 7.0 (GLOVE) ×2
GLOVE ECLIPSE 7.0 STRL STRAW (GLOVE) ×6 IMPLANT
GLOVE SKINSENSE NS SZ7.5 (GLOVE) ×1
GLOVE SKINSENSE STRL SZ7.5 (GLOVE) ×2 IMPLANT
GLOVE SURG SYN 7.5  E (GLOVE) ×2
GLOVE SURG SYN 7.5 E (GLOVE) ×4 IMPLANT
GOWN STRL REIN XL XLG (GOWN DISPOSABLE) ×3 IMPLANT
GOWN STRL REUS W/ TWL LRG LVL3 (GOWN DISPOSABLE) ×2 IMPLANT
GOWN STRL REUS W/ TWL XL LVL3 (GOWN DISPOSABLE) ×2 IMPLANT
GOWN STRL REUS W/TWL LRG LVL3 (GOWN DISPOSABLE) ×1
GOWN STRL REUS W/TWL XL LVL3 (GOWN DISPOSABLE) ×1
IMMOBILIZER SHOULDER FOAM XLGE (SOFTGOODS) IMPLANT
KIT SHOULDER TRACTION (DRAPES) ×3 IMPLANT
LOOP 2 FIBERLINK CLOSED (SUTURE) ×3 IMPLANT
MANIFOLD NEPTUNE II (INSTRUMENTS) ×3 IMPLANT
NEEDLE SCORPION MULTI FIRE (NEEDLE) IMPLANT
PACK ARTHROSCOPY DSU (CUSTOM PROCEDURE TRAY) ×3 IMPLANT
PACK BASIN DAY SURGERY FS (CUSTOM PROCEDURE TRAY) ×3 IMPLANT
PROBE BIPOLAR ATHRO 135MM 90D (MISCELLANEOUS) ×3 IMPLANT
SHEET MEDIUM DRAPE 40X70 STRL (DRAPES) ×3 IMPLANT
SLEEVE SCD COMPRESS KNEE MED (MISCELLANEOUS) ×3 IMPLANT
SLING ARM FOAM STRAP LRG (SOFTGOODS) IMPLANT
SLING ARM IMMOBILIZER LRG (SOFTGOODS) IMPLANT
SLING ARM IMMOBILIZER MED (SOFTGOODS) IMPLANT
SLING ARM MED ADULT FOAM STRAP (SOFTGOODS) IMPLANT
SLING ARM XL FOAM STRAP (SOFTGOODS) ×3 IMPLANT
STRIP CLOSURE SKIN 1/2X4 (GAUZE/BANDAGES/DRESSINGS) IMPLANT
SUT ETHILON 3 0 PS 1 (SUTURE) ×3 IMPLANT
SUT FIBERWIRE #2 38 T-5 BLUE (SUTURE)
SUT MNCRL AB 4-0 PS2 18 (SUTURE) ×3 IMPLANT
SUT PDS AB 1 CT  36 (SUTURE)
SUT PDS AB 1 CT 36 (SUTURE) IMPLANT
SUT TIGER TAPE 7 IN WHITE (SUTURE) IMPLANT
SUT VIC AB 2-0 CT1 27 (SUTURE) ×1
SUT VIC AB 2-0 CT1 TAPERPNT 27 (SUTURE) ×2 IMPLANT
SUTURE FIBERWR #2 38 T-5 BLUE (SUTURE) IMPLANT
SUTURE TAPE TIGERLINK 1.3MM BL (SUTURE) ×2 IMPLANT
SUTURETAPE TIGERLINK 1.3MM BL (SUTURE) ×3
SYR 50ML LL SCALE MARK (SYRINGE) ×3 IMPLANT
TOWEL GREEN STERILE FF (TOWEL DISPOSABLE) ×3 IMPLANT
TOWEL OR NON WOVEN STRL DISP B (DISPOSABLE) ×3 IMPLANT
TUBE CONNECTING 20X1/4 (TUBING) IMPLANT
TUBING ARTHRO INFLOW-ONLY STRL (TUBING) ×3 IMPLANT
WATER STERILE IRR 1000ML POUR (IV SOLUTION) ×3 IMPLANT

## 2018-02-14 NOTE — Anesthesia Procedure Notes (Signed)
Anesthesia Regional Block: Interscalene brachial plexus block   Pre-Anesthetic Checklist: ,, timeout performed, Correct Patient, Correct Site, Correct Laterality, Correct Procedure,, site marked, risks and benefits discussed, Surgical consent,  Pre-op evaluation,  At surgeon's request and post-op pain management  Laterality: Left  Prep: chloraprep       Needles:  Injection technique: Single-shot  Needle Type: Echogenic Stimulator Needle     Needle Length: 10cm  Needle Gauge: 21     Additional Needles:   Procedures:,,,, ultrasound used (permanent image in chart),,,,  Narrative:  Start time: 02/14/2018 7:05 AM End time: 02/14/2018 7:15 AM Injection made incrementally with aspirations every 5 mL.  Performed by: Personally  Anesthesiologist: Leonides Grills, MD  Additional Notes: Functioning IV was confirmed and monitors were applied.  A 21ga Pajunk echogenic stimulator needle was used. Sterile prep, hand hygiene and sterile gloves were used.  Negative aspiration and negative test dose prior to incremental administration of local anesthetic. The patient tolerated the procedure well.

## 2018-02-14 NOTE — H&P (Signed)
    PREOPERATIVE H&P  Chief Complaint: left superior labrum anterior posterior tear  HPI: Ralph Perez is a 42 y.o. male who presents for surgical treatment of left superior labrum anterior posterior tear.  He denies any changes in medical history.  Past Medical History:  Diagnosis Date  . Facial neuralgia   . FHx: brain aneurysm   . GERD (gastroesophageal reflux disease)   . Vertigo    Past Surgical History:  Procedure Laterality Date  . APPENDECTOMY    . COLONOSCOPY    . TONSILLECTOMY     as a child   . UPPER GASTROINTESTINAL ENDOSCOPY     Social History   Socioeconomic History  . Marital status: Married    Spouse name: Not on file  . Number of children: Not on file  . Years of education: Not on file  . Highest education level: Not on file  Occupational History  . Not on file  Social Needs  . Financial resource strain: Not on file  . Food insecurity:    Worry: Not on file    Inability: Not on file  . Transportation needs:    Medical: Not on file    Non-medical: Not on file  Tobacco Use  . Smoking status: Never Smoker  . Smokeless tobacco: Never Used  Substance and Sexual Activity  . Alcohol use: Yes    Comment: very rare  . Drug use: No  . Sexual activity: Not on file  Lifestyle  . Physical activity:    Days per week: Not on file    Minutes per session: Not on file  . Stress: Not on file  Relationships  . Social connections:    Talks on phone: Not on file    Gets together: Not on file    Attends religious service: Not on file    Active member of club or organization: Not on file    Attends meetings of clubs or organizations: Not on file    Relationship status: Not on file  Other Topics Concern  . Not on file  Social History Narrative  . Not on file   Family History  Problem Relation Age of Onset  . Aneurysm Father   . Clotting disorder Father    No Known Allergies Prior to Admission medications   Medication Sig Start Date End Date  Taking? Authorizing Provider  Multiple Vitamin (MULTIVITAMIN) tablet Take 1 tablet by mouth daily.   Yes [provider]  omeprazole (PRILOSEC) 20 MG capsule Take by mouth daily. 01/08/18  Yes [provider]     Positive ROS: All other systems have been reviewed and were otherwise negative with the exception of those mentioned in the HPI and as above.  Physical Exam: General: Alert, no acute distress Cardiovascular: No pedal edema Respiratory: No cyanosis, no use of accessory musculature GI: abdomen soft Skin: No lesions in the area of chief complaint Neurologic: Sensation intact distally Psychiatric: Patient is competent for consent with normal mood and affect Lymphatic: no lymphedema  MUSCULOSKELETAL: exam stable  Assessment: left superior labrum anterior posterior tear  Plan: Plan for Procedure(s): LEFT SHOULDER ARTHROSCOPY WITH EXTENSIVE DEBRIDEMENT AND BICEPS TENODESIS  The risks benefits and alternatives were discussed with the patient including but not limited to the risks of nonoperative treatment, versus surgical intervention including infection, bleeding, nerve injury,  blood clots, cardiopulmonary complications, morbidity, mortality, among others, and they were willing to proceed.   Glee Arvin, MD   02/14/2018 7:21 AM

## 2018-02-14 NOTE — Op Note (Signed)
   Date of Surgery: 01/27/2016  INDICATIONS: The patient is a 42 y.o.-year-old male with left shoulder pain that has failed conservative treatment;  The patient did consent to the procedure after discussion of the risks and benefits.  PREOPERATIVE DIAGNOSIS:  1. Left shoulder SLAP tear 2. Left rotator cuff tendinosis 3. Left superior posterior degenerative labral tear  POSTOPERATIVE DIAGNOSIS: Same.  PROCEDURE:  1.  Left shoulder arthroscopic biceps tenodesis 2.  Left shoulder arthroscopic extensive debridement of articular surface of rotator cuff, degenerative labrum, rotator interval  SURGEON: N. Glee Arvin, M.D.  ASSIST: Starlyn Skeans [place], [place]; necessary for the timely completion of procedure and due to complexity of procedure..  ANESTHESIA:  general, regional  IV FLUIDS AND URINE: See anesthesia.  ESTIMATED BLOOD LOSS: minimal mL.  IMPLANTS: Arthrex 4.75 mm self punching bio composite anchor  COMPLICATIONS: None.  DESCRIPTION OF PROCEDURE: The patient was brought to the operating room and placed supine on the operating table.  The patient had been signed prior to the procedure and this was documented. The patient had the anesthesia placed by the anesthesiologist.  A time-out was performed to confirm that this was the correct patient, site, side and location. The patient did receive antibiotics prior to the incision and was re-dosed during the procedure as needed at indicated intervals.  The patient was then moved into the lateral position with the operative extremity suspended in the fishing pole mechanism. The patient had the operative extremity prepped and draped in the standard surgical fashion.    Standard posterior and anterior shoulder arthroscopy portals were created.  We first performed a diagnostic shoulder arthroscopy.  There was no evidence of chondromalacia of the glenohumeral joint.  He did exhibit a Buford complex.  The articular surface of the supraspinatus and  infraspinatus shows some mild tendinosis which was then gently debrided.  He also exhibited a degenerative posterior superior labral tear which was debrided.  The rotator interval was then also debrided gently.  We then performed an arthroscopic biceps tenodesis using the Arthrex loop and tach suture method.  The biceps was tenodesed just superior to the subscapularis tendon in the bicipital groove.  The anchor had excellent purchase.  Excess fluid was then drained from the shoulder joint.  The incisions were closed with interrupted nylon sutures.  Sterile dressings were applied.  Patient was placed in a shoulder sling.  He tolerated procedure well had no major complications.  POSTOPERATIVE PLAN: Discharge home.  Sling for comfort.  Follow-up in 2 weeks.  Mayra Reel, MD [facility] [phone] 10:16 AM

## 2018-02-14 NOTE — Discharge Instructions (Signed)
Post-operative patient instructions  Shoulder Arthroscopy    Ice:  Place intermittent ice or cooler pack over your shoulder, 30 minutes on and 30 minutes off.  Continue this for the first 72 hours after surgery, then save ice for use after therapy sessions or on more active days.    Weight:  You may bear light weight on your arm as your symptoms allow.  Motion:  Perform gentle shoulder motion as tolerated  Dressing:  Perform 1st dressing change at 2 days postoperative. A moderate amount of blood tinged drainage is to be expected.  So if you bleed through the dressing on the first or second day or if you have fevers, it is fine to change the dressing/check the wounds early and redress wound.  If it bleeds through again, or if the incisions are leaking frank blood, please call the office. May change dressing every 1-2 days thereafter to help watch wounds. Can purchase Tegaderm (or 63M Nexcare) water resistant dressings at local pharmacy / [facility].  Shower:  Light shower is ok after 2 days.  Please take shower, NO bath. Recover with gauze and ace wrap to help keep wounds protected.    Pain medication:  A narcotic pain medication has been prescribed.  Take as directed.  Typically you need narcotic pain medication more regularly during the first 3 to 5 days after surgery.  Decrease your use of the medication as the pain improves.  Narcotics can sometimes cause constipation, even after a few doses.  If you have problems with constipation, you can take an over the counter stool softener or light laxative.  If you have persistent problems, please notify your physicians office.  Physical therapy: Additional activity guidelines to be provided by your physician or physical therapist at follow-up visits.   Driving: Do not recommend driving x 2 weeks post surgical, especially if surgery performed on right side. Should not drive while taking narcotic pain medications. It typically takes at least 2 weeks to  restore sufficient neuromuscular function for normal reaction times for driving safety.   Call [phone] for questions or problems. Evenings you will be forwarded to the hospital operator.  Ask for the orthopaedic physician on call. Please call if you experience:    o Redness, foul smelling, or persistent drainage from the surgical site  o worsening shoulder pain and swelling not responsive to medication  o any calf pain and or swelling of the lower leg  o temperatures greater than 101.5 F o other questions or concerns   Thank you for allowing [place] to be a part of your care.    Regional Anesthesia Blocks  1. Numbness or the inability to move the "blocked" extremity may last from 3-48 hours after placement. The length of time depends on the medication injected and your individual response to the medication. If the numbness is not going away after 48 hours, call your surgeon.  2. The extremity that is blocked will need to be protected until the numbness is gone and the  Strength has returned. Because you cannot feel it, you will need to take extra care to avoid injury. Because it may be weak, you may have difficulty moving it or using it. You may not know what position it is in without looking at it while the block is in effect.  3. For blocks in the legs and feet, returning to weight bearing and walking needs to be done carefully. You will need to wait until the numbness is  entirely gone and the strength has returned. You should be able to move your leg and foot normally before you try and bear weight or walk. You will need someone to be with you when you first try to ensure you do not fall and possibly risk injury.  4. Bruising and tenderness at the needle site are common side effects and will resolve in a few days.  5. Persistent numbness or new problems with movement should be communicated to the surgeon or the [facility] [phone] [facility]  [phone]).   Post Anesthesia Home Care Instructions  Activity: Get plenty of rest for the remainder of the day. A responsible individual must stay with you for 24 hours following the procedure.  For the next 24 hours, DO NOT: -Drive a car -Advertising copywriter -Drink alcoholic beverages -Take any medication unless instructed by your physician -Make any legal decisions or sign important papers.  Meals: Start with liquid foods such as gelatin or soup. Progress to regular foods as tolerated. Avoid greasy, spicy, heavy foods. If nausea and/or vomiting occur, drink only clear liquids until the nausea and/or vomiting subsides. Call your physician if vomiting continues.  Special Instructions/Symptoms: Your throat may feel dry or sore from the anesthesia or the breathing tube placed in your throat during surgery. If this causes discomfort, gargle with warm salt water. The discomfort should disappear within 24 hours.  If you had a scopolamine patch placed behind your ear for the management of post- operative nausea and/or vomiting:  1. The medication in the patch is effective for 72 hours, after which it should be removed.  Wrap patch in a tissue and discard in the trash. Wash hands thoroughly with soap and water. 2. You may remove the patch earlier than 72 hours if you experience unpleasant side effects which may include dry mouth, dizziness or visual disturbances. 3. Avoid touching the patch. Wash your hands with soap and water after contact with the patch.

## 2018-02-14 NOTE — Transfer of Care (Signed)
Immediate Anesthesia Transfer of Care Note  Patient: Ralph Perez  Procedure(s) Performed: LEFT SHOULDER ARTHROSCOPY WITH EXTENSIVE DEBRIDEMENT AND BICEPS TENODESIS (Left Shoulder) BICEPS TENODESIS (Shoulder)  Patient Location: PACU  Anesthesia Type:General  Level of Consciousness: awake, alert  and oriented  Airway & Oxygen Therapy: Patient Spontanous Breathing and Patient connected to face mask oxygen  Post-op Assessment: Report given to RN and Post -op Vital signs reviewed and stable  Post vital signs: Reviewed and stable  Last Vitals:  Vitals Value Taken Time  BP 145/89 02/14/2018  9:00 AM  Temp    Pulse 70 02/14/2018  9:01 AM  Resp 14 02/14/2018  9:01 AM  SpO2 98 % 02/14/2018  9:01 AM  Vitals shown include unvalidated device data.  Last Pain:  Vitals:   02/14/18 0643  TempSrc: Oral  PainSc: 0-No pain      Patients Stated Pain Goal: 0 (02/14/18 2130)  Complications: No apparent anesthesia complications

## 2018-02-14 NOTE — Anesthesia Procedure Notes (Signed)
Procedure Name: Intubation Performed by: York Grice, CRNA Pre-anesthesia Checklist: Patient identified, Emergency Drugs available, Suction available and Patient being monitored Patient Re-evaluated:Patient Re-evaluated prior to induction Oxygen Delivery Method: Circle system utilized Preoxygenation: Pre-oxygenation with 100% oxygen Induction Type: IV induction Ventilation: Mask ventilation without difficulty Laryngoscope Size: Miller and 2 Grade View: Grade II Tube type: Oral Tube size: 7.0 mm Number of attempts: 2 Airway Equipment and Method: Stylet Placement Confirmation: ETT inserted through vocal cords under direct vision,  positive ETCO2 and breath sounds checked- equal and bilateral Secured at: 22 cm Tube secured with: Tape Dental Injury: Teeth and Oropharynx as per pre-operative assessment

## 2018-02-14 NOTE — Anesthesia Postprocedure Evaluation (Signed)
Anesthesia Post Note  Patient: Ralph Perez  Procedure(s) Performed: LEFT SHOULDER ARTHROSCOPY WITH EXTENSIVE DEBRIDEMENT AND BICEPS TENODESIS (Left Shoulder) BICEPS TENODESIS (Shoulder)     Patient location during evaluation: PACU Anesthesia Type: Regional and General Level of consciousness: awake and alert Pain management: pain level controlled Vital Signs Assessment: post-procedure vital signs reviewed and stable Respiratory status: spontaneous breathing, nonlabored ventilation, respiratory function stable and patient connected to nasal cannula oxygen Cardiovascular status: blood pressure returned to baseline and stable Postop Assessment: no apparent nausea or vomiting Anesthetic complications: no    Last Vitals:  Vitals:   02/14/18 0930 02/14/18 1007  BP: 136/86 138/86  Pulse: (!) 56 (!) 59  Resp: 15 18  Temp:  36.6 C  SpO2: 96% 99%    Last Pain:  Vitals:   02/14/18 1007  TempSrc:   PainSc: 0-No pain                 Ryan P Ellender

## 2018-02-14 NOTE — Progress Notes (Signed)
AssistedDr. Ellender with left, ultrasound guided, interscalene  block. Side rails up, monitors on throughout procedure. See vital signs in flow sheet. Tolerated Procedure well.  

## 2018-02-14 NOTE — Anesthesia Preprocedure Evaluation (Addendum)
Anesthesia Evaluation  Patient identified by MRN, date of birth, ID band Patient awake    Reviewed: Allergy & Precautions, NPO status , Patient's Chart, lab work & pertinent test results  Airway Mallampati: II  TM Distance: >3 FB Neck ROM: Full    Dental no notable dental hx.    Pulmonary neg pulmonary ROS,    Pulmonary exam normal breath sounds clear to auscultation       Cardiovascular negative cardio ROS Normal cardiovascular exam Rhythm:Regular Rate:Normal     Neuro/Psych Facial neuralgia Vertigo negative psych ROS   GI/Hepatic Neg liver ROS, GERD  Medicated and Controlled,  Endo/Other  negative endocrine ROS  Renal/GU negative Renal ROS     Musculoskeletal negative musculoskeletal ROS (+)   Abdominal   Peds  Hematology negative hematology ROS (+)   Anesthesia Other Findings left superior labrum anterior posterior tear  Reproductive/Obstetrics                           Anesthesia Physical Anesthesia Plan  ASA: II  Anesthesia Plan: General and Regional   Post-op Pain Management: GA combined w/ Regional for post-op pain   Induction: Intravenous  PONV Risk Score and Plan: 2 and Ondansetron, Dexamethasone, Midazolam and Treatment may vary due to age or medical condition  Airway Management Planned: Oral ETT  Additional Equipment:   Intra-op Plan:   Post-operative Plan: Extubation in OR  Informed Consent: I have reviewed the patients History and Physical, chart, labs and discussed the procedure including the risks, benefits and alternatives for the proposed anesthesia with the patient or authorized representative who has indicated his/her understanding and acceptance.   Dental advisory given  Plan Discussed with: CRNA  Anesthesia Plan Comments:         Anesthesia Quick Evaluation

## 2018-02-15 ENCOUNTER — Encounter (HOSPITAL_BASED_OUTPATIENT_CLINIC_OR_DEPARTMENT_OTHER): Payer: Self-pay | Admitting: Orthopaedic Surgery

## 2018-02-28 ENCOUNTER — Inpatient Hospital Stay (INDEPENDENT_AMBULATORY_CARE_PROVIDER_SITE_OTHER): Payer: Managed Care, Other (non HMO) | Admitting: Orthopaedic Surgery

## 2018-03-02 ENCOUNTER — Ambulatory Visit (INDEPENDENT_AMBULATORY_CARE_PROVIDER_SITE_OTHER): Payer: Managed Care, Other (non HMO) | Admitting: Orthopaedic Surgery

## 2018-03-02 ENCOUNTER — Encounter (INDEPENDENT_AMBULATORY_CARE_PROVIDER_SITE_OTHER): Payer: Self-pay | Admitting: Orthopaedic Surgery

## 2018-03-02 DIAGNOSIS — S43432A Superior glenoid labrum lesion of left shoulder, initial encounter: Secondary | ICD-10-CM

## 2018-03-02 NOTE — Progress Notes (Signed)
Patient is two-week status post left shoulder arthroscopy with biceps tenodesis.  He is doing well.  He has really good range of motion.  His forward flexion and internal rotation are very good.  He does have some discomfort with shoulder abduction.  His surgical incisions have fully healed without any signs of infection or drainage.  He has minimal swelling.  I reviewed the arthroscopy pictures with the patient.  At this point we will send him to outpatient physical therapy.  Recheck in 4 weeks.

## 2018-03-14 ENCOUNTER — Telehealth (INDEPENDENT_AMBULATORY_CARE_PROVIDER_SITE_OTHER): Payer: Self-pay | Admitting: Orthopaedic Surgery

## 2018-03-14 NOTE — Telephone Encounter (Signed)
OP note fax[facility] to [facility] [phone]

## 2018-03-19 ENCOUNTER — Telehealth (INDEPENDENT_AMBULATORY_CARE_PROVIDER_SITE_OTHER): Payer: Self-pay | Admitting: Orthopaedic Surgery

## 2018-03-19 NOTE — Telephone Encounter (Signed)
Progress with stregnthening and ROM as tolerated.

## 2018-03-19 NOTE — Telephone Encounter (Signed)
[facility] @ [facility] request documentation of the Protcol for treatment for the patient.   Callback # [phone] Fax # [phone]

## 2018-03-19 NOTE — Telephone Encounter (Signed)
Please advise 

## 2018-03-21 NOTE — Telephone Encounter (Signed)
Called beth to advise.

## 2018-04-10 ENCOUNTER — Ambulatory Visit (INDEPENDENT_AMBULATORY_CARE_PROVIDER_SITE_OTHER): Payer: Managed Care, Other (non HMO) | Admitting: Orthopaedic Surgery

## 2018-06-16 IMAGING — MR MR SHOULDER*L* W/ CM
6 series · 40 of 40 positions shown · IV contrast (agent unspecified)
Comparison: Left shoulder x-rays dated August 21, 2017.

CLINICAL DATA: Chronic left shoulder pain and limited range of
motion for the past 5 months. No known injury.

EXAM:
MR ARTHROGRAM OF THE LEFT SHOULDER
TECHNIQUE: Multiplanar, multisequence MR imaging of the left shoulder was
performed following the administration of intra-articular contrast.
CONTRAST:  See Injection Documentation.

[Series 3: T1 fat-sat · axial · 4.0mm · 0.25mm/px · z∈[-60,+36]mm · 7 of 22 slices shown (1 of 4)]
[im 1/22]
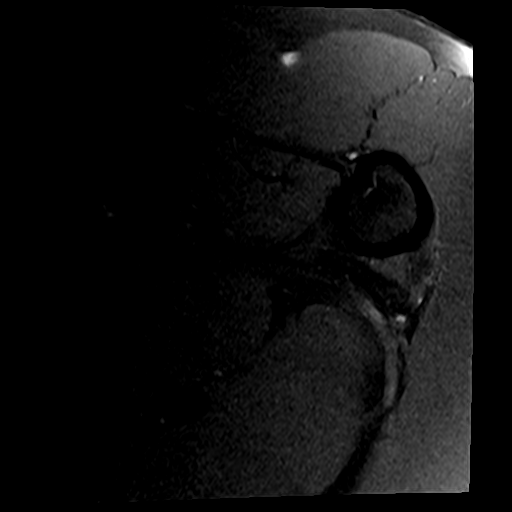
[im 4/22]
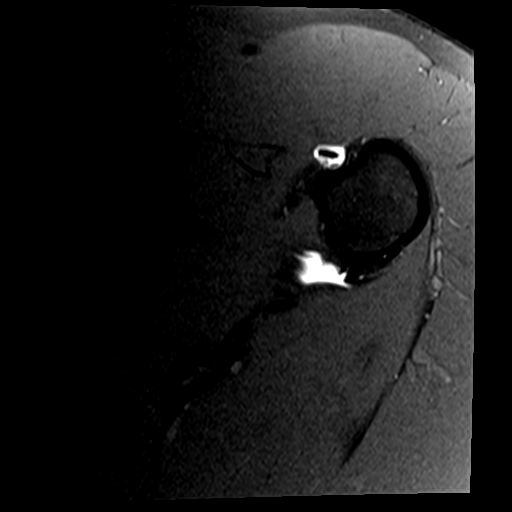
[im 8/22]
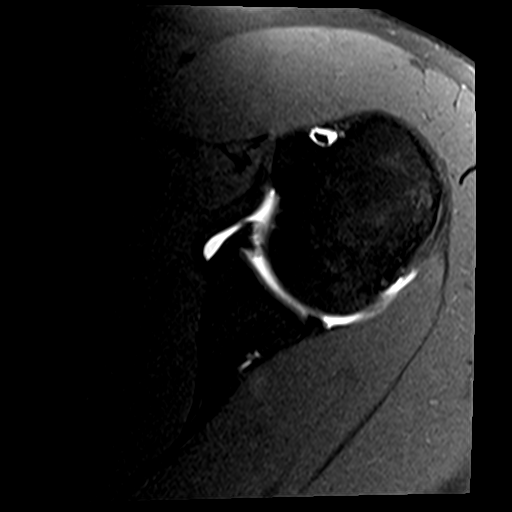
[im 11/22]
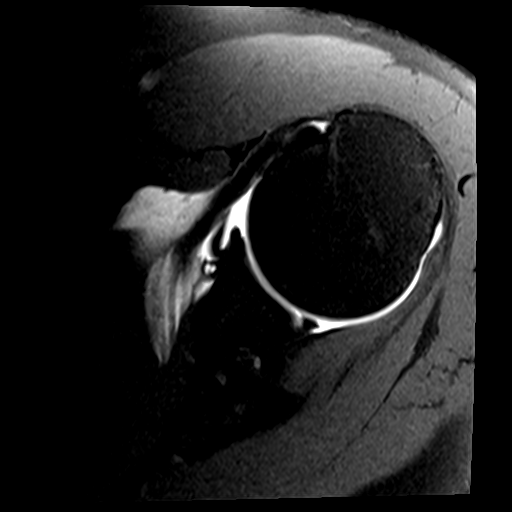
[im 15/22]
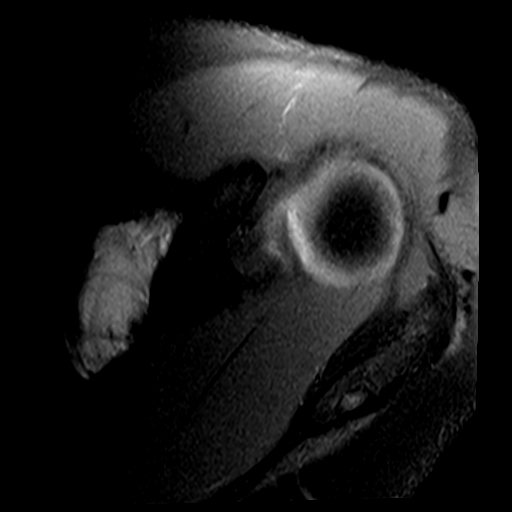
[im 18/22]
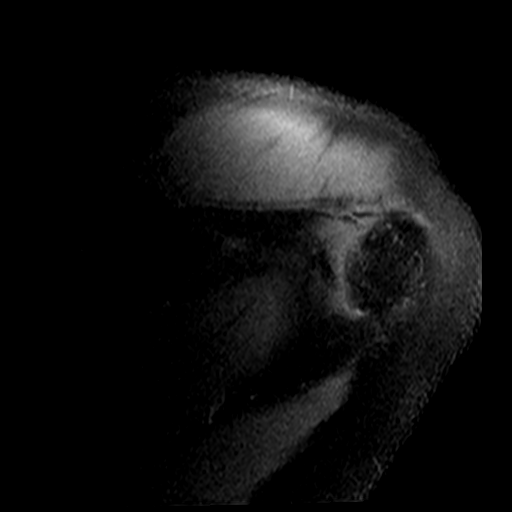
[im 22/22]
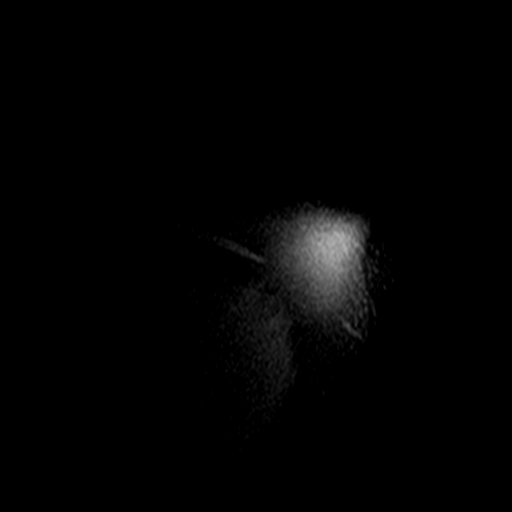

[Series 4: T2 fat-sat · coronal · 4.0mm · 0.55mm/px · 6 of 20 slices shown (1 of 2)]
[im 1/20]
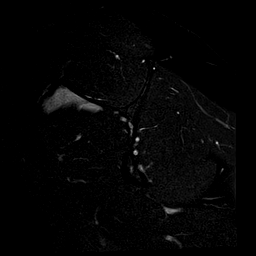
[im 4/20]
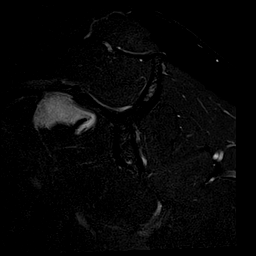
[im 8/20]
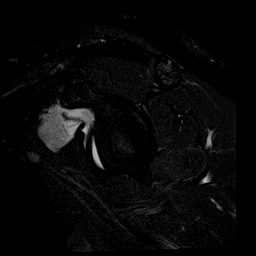
[im 12/20]
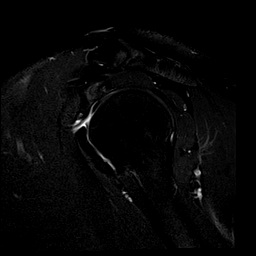
[im 16/20]
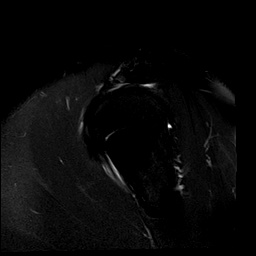
[im 20/20]
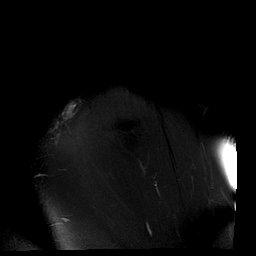

[Series 5: T1 fat-sat · sagittal · 4.0mm · 0.44mm/px · 7 of 19 slices shown (2 of 4)]
[im 1/19]
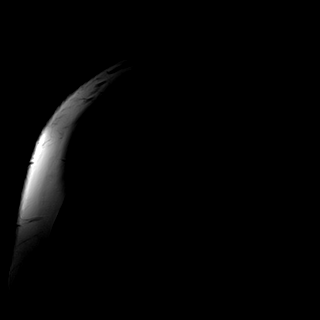
[im 4/19]
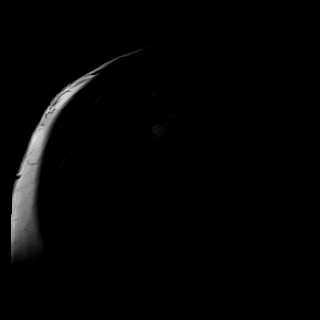
[im 7/19]
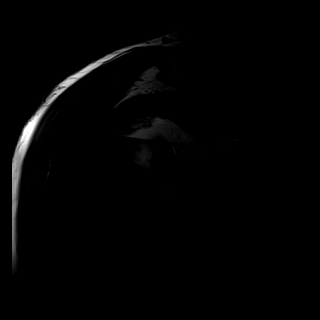
[im 10/19]
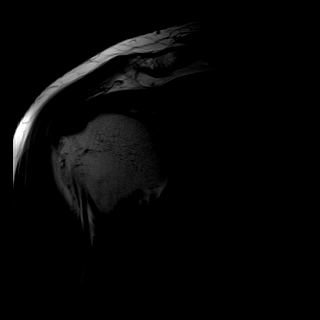
[im 13/19]
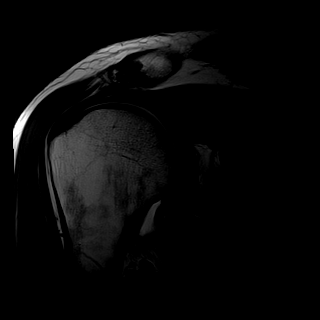
[im 16/19]
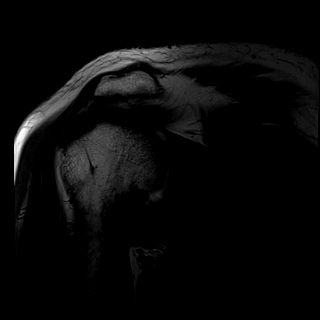
[im 19/19]
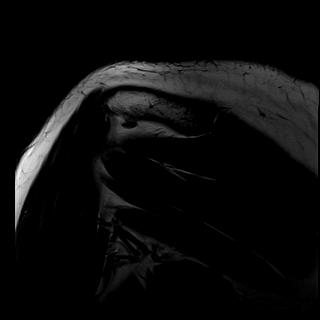

[Series 6: T1 fat-sat · sagittal · 4.0mm · 0.55mm/px · 7 of 19 slices shown (3 of 4)]
[im 1/19]
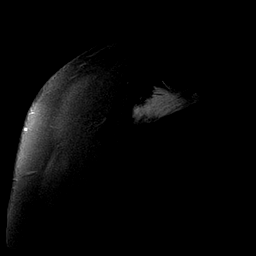
[im 4/19]
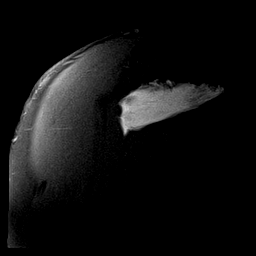
[im 7/19]
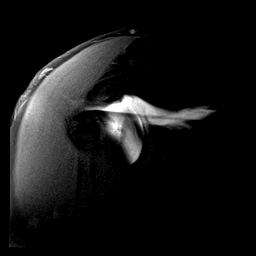
[im 10/19]
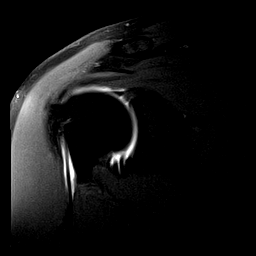
[im 13/19]
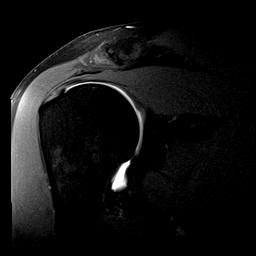
[im 16/19]
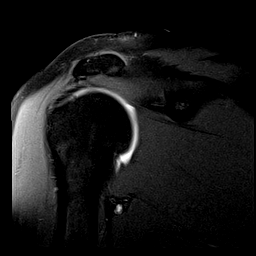
[im 19/19]
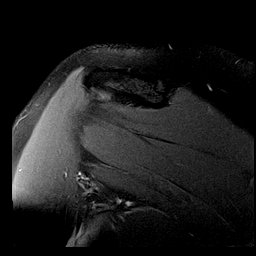

[Series 7: T2 fat-sat · sagittal · 4.0mm · 0.55mm/px · 7 of 19 slices shown (2 of 2)]
[im 1/19]
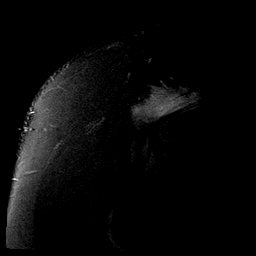
[im 4/19]
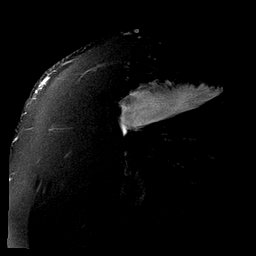
[im 7/19]
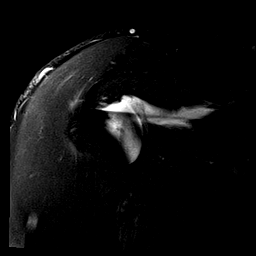
[im 10/19]
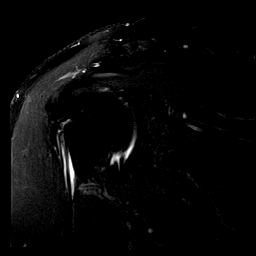
[im 13/19]
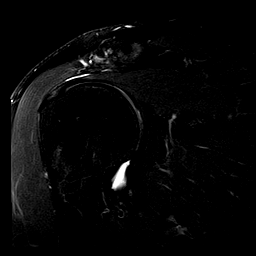
[im 16/19]
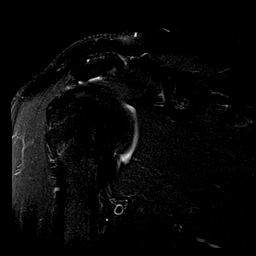
[im 19/19]
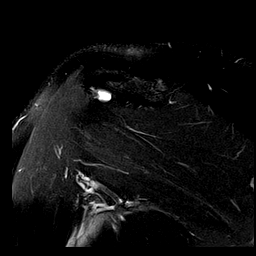

[Series 10: T1 fat-sat · sagittal · 4.0mm · 0.59mm/px · 6 of 16 slices shown (4 of 4)]
[im 1/16]
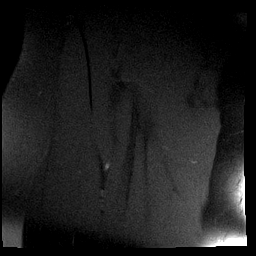
[im 4/16]
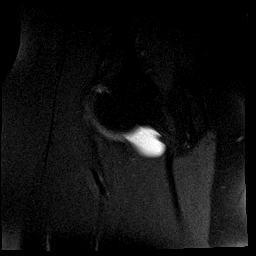
[im 7/16]
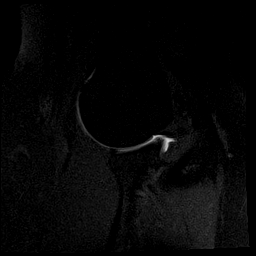
[im 10/16]
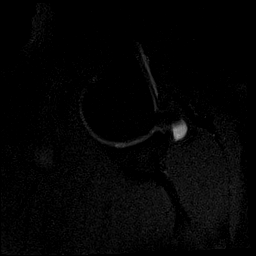
[im 13/16]
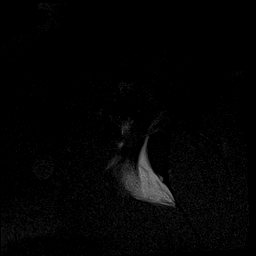
[im 16/16]
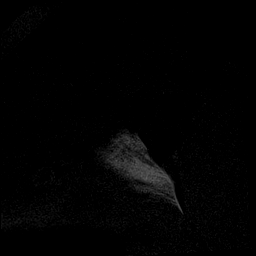

[40 of 40 positions shown; findings below may reference images not displayed]

FINDINGS: Rotator cuff: Mild supraspinatus and infraspinatus tendinosis. No
tear. The subscapularis and teres minor tendons are intact.

Muscles:  No focal muscular atrophy or edema.

Biceps long head:  Intact and normally positioned.

Acromioclavicular Joint: The acromion is type II. There are mild
acromioclavicular degenerative changes. Small amount of fluid, but
no contrast within the subacromial/subdeltoid bursa.

Glenohumeral Joint: Distended with intra-articular contrast. No
chondral defect.

Labrum:  Superior labral tear.  Remaining labrum is intact.

Bones: No acute or significant extra-articular osseous findings.

Other: No significant soft tissue findings.
IMPRESSION: 1. Superior labral tear.
2. Mild supraspinatus and infraspinatus tendinosis. No rotator cuff
tear.
3. Mild acromioclavicular osteoarthritis.
4. Mild subacromial/subdeltoid bursitis.
